# Patient Record
Sex: Female | Born: 1960 | Hispanic: No | Marital: Single | Smoking: Never smoker
Health system: Southern US, Community
[De-identification: ages and names within clinical notes are randomized; demographics above are authoritative.]

## PROBLEM LIST (undated history)

## (undated) DIAGNOSIS — I10 Essential (primary) hypertension: Secondary | ICD-10-CM

## (undated) DIAGNOSIS — N289 Disorder of kidney and ureter, unspecified: Secondary | ICD-10-CM

## (undated) DIAGNOSIS — M109 Gout, unspecified: Secondary | ICD-10-CM

## (undated) DIAGNOSIS — E7800 Pure hypercholesterolemia, unspecified: Secondary | ICD-10-CM

---

## 2017-03-22 ENCOUNTER — Emergency Department (HOSPITAL_BASED_OUTPATIENT_CLINIC_OR_DEPARTMENT_OTHER): Payer: BLUE CROSS/BLUE SHIELD

## 2017-03-22 ENCOUNTER — Emergency Department (HOSPITAL_BASED_OUTPATIENT_CLINIC_OR_DEPARTMENT_OTHER)
Admission: EM | Admit: 2017-03-22 | Discharge: 2017-03-22 | Disposition: A | Discharge status: Home / Self Care | Payer: BLUE CROSS/BLUE SHIELD | Attending: Emergency Medicine | Admitting: Emergency Medicine

## 2017-03-22 ENCOUNTER — Encounter (HOSPITAL_BASED_OUTPATIENT_CLINIC_OR_DEPARTMENT_OTHER): Payer: Self-pay | Admitting: Emergency Medicine

## 2017-03-22 DIAGNOSIS — M25572 Pain in left ankle and joints of left foot: Secondary | ICD-10-CM | POA: Insufficient documentation

## 2017-03-22 DIAGNOSIS — Z79899 Other long term (current) drug therapy: Secondary | ICD-10-CM | POA: Diagnosis not present

## 2017-03-22 DIAGNOSIS — R2242 Localized swelling, mass and lump, left lower limb: Secondary | ICD-10-CM | POA: Diagnosis present

## 2017-03-22 DIAGNOSIS — I10 Essential (primary) hypertension: Secondary | ICD-10-CM | POA: Diagnosis not present

## 2017-03-22 HISTORY — DX: Disorder of kidney and ureter, unspecified: N28.9

## 2017-03-22 HISTORY — DX: Gout, unspecified: M10.9

## 2017-03-22 HISTORY — DX: Essential (primary) hypertension: I10

## 2017-03-22 HISTORY — DX: Pure hypercholesterolemia, unspecified: E78.00

## 2017-03-22 MED ORDER — OXYCODONE-ACETAMINOPHEN 5-325 MG PO TABS
1.0000 | ORAL_TABLET | Freq: Once | ORAL | Status: AC
  Administered 2017-03-22: 1 via ORAL
  Filled 2017-03-22: qty 1

## 2017-03-22 MED ORDER — ONDANSETRON 4 MG PO TBDP
4.0000 mg | ORAL_TABLET | Freq: Once | ORAL | Status: AC
  Administered 2017-03-22: 4 mg via ORAL
  Filled 2017-03-22: qty 1

## 2017-03-22 MED ORDER — ACETAMINOPHEN 500 MG PO TABS
500.0000 mg | ORAL_TABLET | Freq: Once | ORAL | Status: AC
  Administered 2017-03-22: 500 mg via ORAL
  Filled 2017-03-22: qty 1

## 2017-03-22 MED ORDER — METHYLPREDNISOLONE 4 MG PO TBPK: ORAL_TABLET | ORAL | 0 refills | Status: AC

## 2017-03-22 MED FILL — METHYLPREDNISOLONE 4 MG TAB: 4 | 21 days supply | Qty: 21 | Fill #0

## 2017-03-22 NOTE — ED Triage Notes (Addendum)
Pt reports L leg swelling and pain x 1 month, denies SOB. States she has gout but this pain is different. Denies recent long travel.

## 2017-03-22 NOTE — Discharge Instructions (Signed)
You were evaluated in the ED for left ankle pain and swelling. Your ultrasound did not show any blood clots. Your x-ray showed some arthritis but no fractures or dislocations.  I suspect this may be a flare of your gout or other possible soft tissue injury.  We discussed many medications to treat your gout. You chose conservative management with Tylenol and steroids. Please take 1000 mg of Tylenol every 8 hours and methylprednisone as prescribed. Ice to decrease inflammation. Follow-up with your doctor's appointment on Thursday and discuss your recurrent joint pain and gout management. Please hold allopurinol until your symptoms improved and you speak to your primary care doctor. See attached gout eating and diet recommendations.  Return to the ED if you develop fevers, chills, worsening pain and swelling.

## 2017-03-22 NOTE — ED Provider Notes (Signed)
[facility] Provider Note   CSN: [id number] Arrival date & time: 03/22/17  0844     History   Chief Complaint Chief Complaint  Patient presents with  . Leg Swelling    HPI Kristie Rios is a 56 y.o. female with history of hypertension, hyperlipidemia, gout, renal disorder presents to the ED for evaluation of persistent left lower leg swelling and pain 1 month. Swelling and pain most significant at left ankle. Aggravating factors include weightbearing, movement of the ankle and palpation. Has recurrent gout 3-4 times a year, takes prednisone as needed for gout flares at home and she took prednisone for her symptoms and it did not help. States her pain today is different from previous gout flares. Has had gout in her left ankle before. Had shrimp one month ago before symptoms started. Denies heavy alcohol intake. No injury. No history of DVT/PE. No recent prolonged travel, history of malignancy, estrogen use. Denies fever, chest pain, shortness of breath, dyspnea, hemoptysis, W/N/T to her distal left lower leg. No history of diabetes or IV drug use.  HPI  Past Medical History:  Diagnosis Date  . Gout   . High cholesterol   . Hypertension   . Renal disorder     There are no active problems to display for this patient.   No past surgical history on file.  OB History    No data available       Home Medications    Prior to Admission medications   Medication Sig Start Date End Date Taking? Authorizing Provider  atorvastatin (LIPITOR) 40 MG tablet Take 40 mg by mouth daily.   Yes [provider]  lisinopril (PRINIVIL,ZESTRIL) 10 MG tablet Take 10 mg by mouth daily.   Yes [provider]  predniSONE (DELTASONE) 5 MG tablet Take 5 mg by mouth daily with breakfast.   Yes [provider]  methylPREDNISolone (MEDROL DOSEPAK) 4 MG TBPK tablet Take until completion for possible gout 03/22/17   Liberty Handy, PA-C    Family History No family  history on file.  Social History Social History  Substance Use Topics  . Smoking status: Never Smoker  . Smokeless tobacco: Never Used  . Alcohol use Not on file     Allergies   Indocin [indomethacin] and Naproxen   Review of Systems Review of Systems  Constitutional: Negative for chills, diaphoresis and fever.  Respiratory: Negative for chest tightness and shortness of breath.   Cardiovascular: Positive for leg swelling. Negative for chest pain.  Gastrointestinal: Negative for nausea and vomiting.  Musculoskeletal: Positive for arthralgias, gait problem, joint swelling and myalgias.  Skin: Negative for wound.  Neurological: Negative for weakness.     Physical Exam Updated Vital Signs BP 93/65   Pulse (!) 59   Temp 97.9 F (36.6 C) (Oral)   Resp 18   Ht 4\' 11"  (1.499 m)   Wt 64.9 kg (143 lb)   SpO2 95%   BMI 28.88 kg/m   Physical Exam  Constitutional: She is oriented to person, place, and time. She appears well-developed and well-nourished. No distress.  NAD.  HENT:  Head: Normocephalic and atraumatic.  Right Ear: External ear normal.  Left Ear: External ear normal.  Nose: Nose normal.  Mouth/Throat: Oropharynx is clear and moist.  Eyes: Conjunctivae and EOM are normal. No scleral icterus.  Neck: Normal range of motion. Neck supple.  Cardiovascular: Normal rate, regular rhythm, S1 normal, S2 normal and normal heart sounds.   No murmur  heard. Pulses:      Radial pulses are 2+ on the right side, and 2+ on the left side.       Popliteal pulses are 2+ on the right side, and 2+ on the left side.       Dorsalis pedis pulses are 2+ on the right side, and 2+ on the left side.  Mild edema and tenderness to left ankle with medial ankle erythema  Mild anterior left thigh tenderness but no calf or popliteal space tenderness No varicosities seen  Pulmonary/Chest: Effort normal and breath sounds normal. She has no wheezes.  Musculoskeletal: Normal range of motion. She  exhibits no deformity.       Left ankle: She exhibits swelling. Tenderness. Lateral malleolus and medial malleolus tenderness found.  Mild edema and tenderness to left ankle  Full PROM but with reported pain  Achilles tendon intact and painless No bony tenderness over posterior aspect of lateral and medial malleoli, navicular bone or 5th metatarsal base.   Negative anterior and posterior drawer.   Neurological: She is alert and oriented to person, place, and time.  Skin: Skin is warm and dry. Capillary refill takes less than 2 seconds. There is erythema.  Mild erythema to medial left ankle without streaking up leg, no warmth or fluctuance  Psychiatric: She has a normal mood and affect. Her behavior is normal. Judgment and thought content normal.  Nursing note and vitals reviewed.    ED Treatments / Results  Labs (all labs ordered are listed, but only abnormal results are displayed) Labs Reviewed - No data to display  EKG  EKG Interpretation None       Radiology Dg Ankle Complete Left  Result Date: 03/22/2017 CLINICAL DATA:  Right ankle pain with swelling and redness for 2 months. EXAM: LEFT ANKLE COMPLETE - 3+ VIEW COMPARISON:  None. FINDINGS: There is no evidence of fracture, dislocation, or joint effusion. Osteophyte formation at the medial aspect of the ankle joint. Circumferential soft tissue swelling. Enthesophytes on the calcaneus. IMPRESSION: Slight arthritic changes.  No acute bony abnormality. Electronically Signed   By: Francene Boyers M.D.   On: 03/22/2017 09:45   US Venous Img Lower Unilateral Left  Result Date: 03/22/2017 CLINICAL DATA:  Left leg pain and swelling for 1 month. EXAM: LEFT LOWER EXTREMITY VENOUS DOPPLER ULTRASOUND TECHNIQUE: Gray-scale sonography with graded compression, as well as color Doppler and duplex ultrasound were performed to evaluate the lower extremity deep venous systems from the level of the common femoral vein and including the common  femoral, femoral, profunda femoral, popliteal and calf veins including the posterior tibial, peroneal and gastrocnemius veins when visible. The superficial great saphenous vein was also interrogated. Spectral Doppler was utilized to evaluate flow at rest and with distal augmentation maneuvers in the common femoral, femoral and popliteal veins. COMPARISON:  None. FINDINGS: Contralateral Common Femoral Vein: Respiratory phasicity is normal and symmetric with the symptomatic side. No evidence of thrombus. Normal compressibility. Common Femoral Vein: No evidence of thrombus. Normal compressibility, respiratory phasicity and response to augmentation. Saphenofemoral Junction: No evidence of thrombus. Normal compressibility and flow on color Doppler imaging. Profunda Femoral Vein: No evidence of thrombus. Normal compressibility and flow on color Doppler imaging. Femoral Vein: No evidence of thrombus. Normal compressibility, respiratory phasicity and response to augmentation. Popliteal Vein: No evidence of thrombus. Normal compressibility, respiratory phasicity and response to augmentation. Calf Veins: No evidence of thrombus. Normal compressibility and flow on color Doppler imaging. Superficial Great Saphenous Vein: No evidence  of thrombus. Normal compressibility and flow on color Doppler imaging. Venous Reflux:  None. Other Findings:  None. IMPRESSION: No evidence of DVT within the left lower extremity. Electronically Signed   By: Myles Rosenthal M.D.   On: 03/22/2017 10:45    Procedures Procedures (including critical care time)  Medications Ordered in ED Medications  acetaminophen (TYLENOL) tablet 500 mg (not administered)  oxyCODONE-acetaminophen (PERCOCET/ROXICET) 5-325 MG per tablet 1 tablet (1 tablet Oral Given 03/22/17 0944)     Initial Impression / Assessment and Plan / ED Course  I have reviewed the triage vital signs and the nursing notes.  Pertinent labs & imaging results that were available during my  care of the patient were reviewed by me and considered in my medical decision making (see chart for details).  Clinical Course as of Mar 22 1138  Tue Mar 22, 2017  1610 FINDINGS: There is no evidence of fracture, dislocation, or joint effusion. Osteophyte formation at the medial aspect of the ankle joint. Circumferential soft tissue swelling. Enthesophytes on the calcaneus.  IMPRESSION: Slight arthritic changes. No acute bony abnormality. DG Ankle Complete Left [CG]    Clinical Course User Index [CG] Liberty Handy, PA-C   56 year old female with history of hypertension, hyperlipidemia, recurrent gout and renal disorder presents to ED for persistent left ankle edema and tenderness x 1 month Medial ankle erythema noted on exam today but no warmth. Has full ankle PROM with mild reported pain. Has had a history of gout to this joint before. Denies trauma. No history of DVT/PE.  No h/o DM or IVDU. No preceding URI or GI illnesses prior. Admits to eating shrimp before symptoms started. Has tried prednisone at home which she typically uses for gout flares but this did not help. Initial differential diagnosis includes septic arthritis,, tendinopathy, reactive arthritis however higher suspicion for gout, arthritis, DVT. Will ice, elevate, percocet and get U/S and x-ray and reassess.   Final Clinical Impressions(s) / ED Diagnoses   Final diagnoses:  Acute left ankle pain   X-ray shows arthritic changes and soft tissue swelling but no other acute pathology. Ultrasound negative for DVT. Discussed medical management of gout with patient. She is taking allopurinol daily, states that her gout has gotten worse since starting this medication. Discussed colchicine and need for creatinine checked today in order to start this medication however patient opted conservative management with Tylenol and methylprednisone. She has a scheduled PCP appointment in 4 days, states she will discuss her symptoms with her  PCP then. We'll discharged with 1 g Tylenol 3 times a day, methylprednisone pack, ice, hold allopurinol. Patient verbalized understanding and is agreeable with plan.  Patient, ED treatment and discharge plan was discussed with supervising physician who is agreeable with plan.  New Prescriptions New Prescriptions   METHYLPREDNISOLONE (MEDROL DOSEPAK) 4 MG TBPK TABLET    Take until completion for possible gout     Liberty Handy, PA-C 03/22/17 1139    Charlynne Pander, MD 03/22/17 [phone]

## 2017-03-22 NOTE — ED Notes (Signed)
Pt did not want ice pack placed due to her being cold.

## 2017-03-22 NOTE — ED Notes (Signed)
Pt taking the tylenol, pt vomited. PA made aware and verbal order for ODT zofran given. PA advised to continue with discharge after giving zofran.

## 2018-04-30 IMAGING — CR DG ANKLE COMPLETE 3+V*L*
3 series · 3 of 3 positions shown · non-contrast
Comparison: None.

CLINICAL DATA: Right ankle pain with swelling and redness for 2
months.

EXAM:
LEFT ANKLE COMPLETE - 3+ VIEW

[t ankle joint ap left]
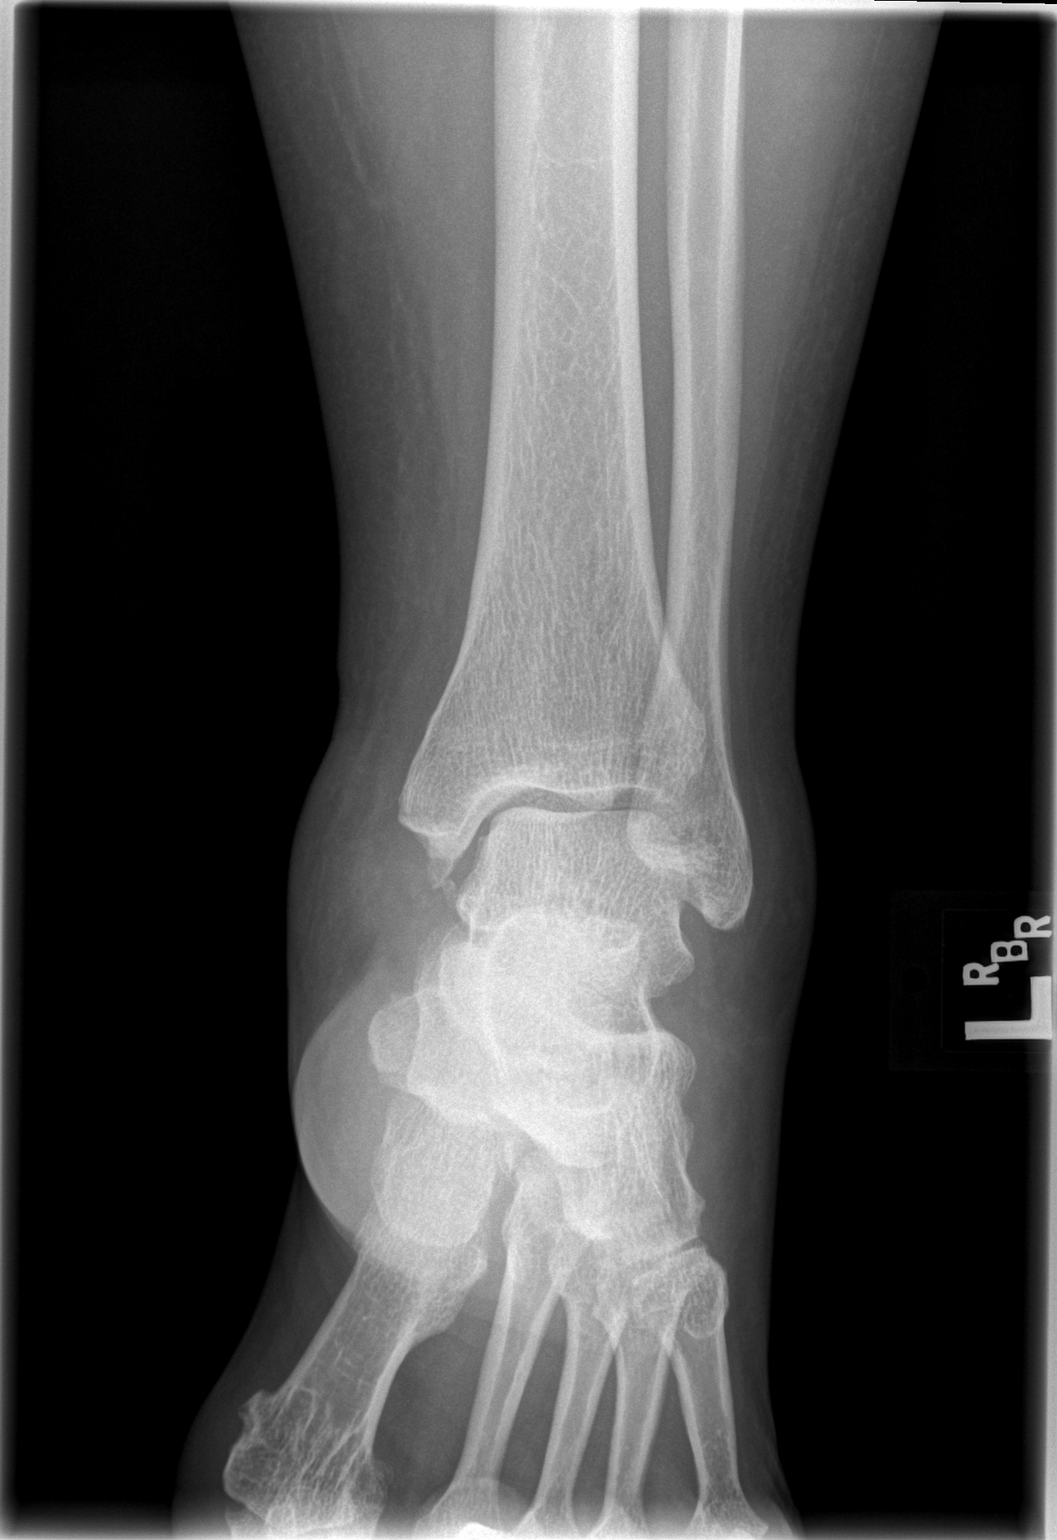

[t ankle joint oblique left]
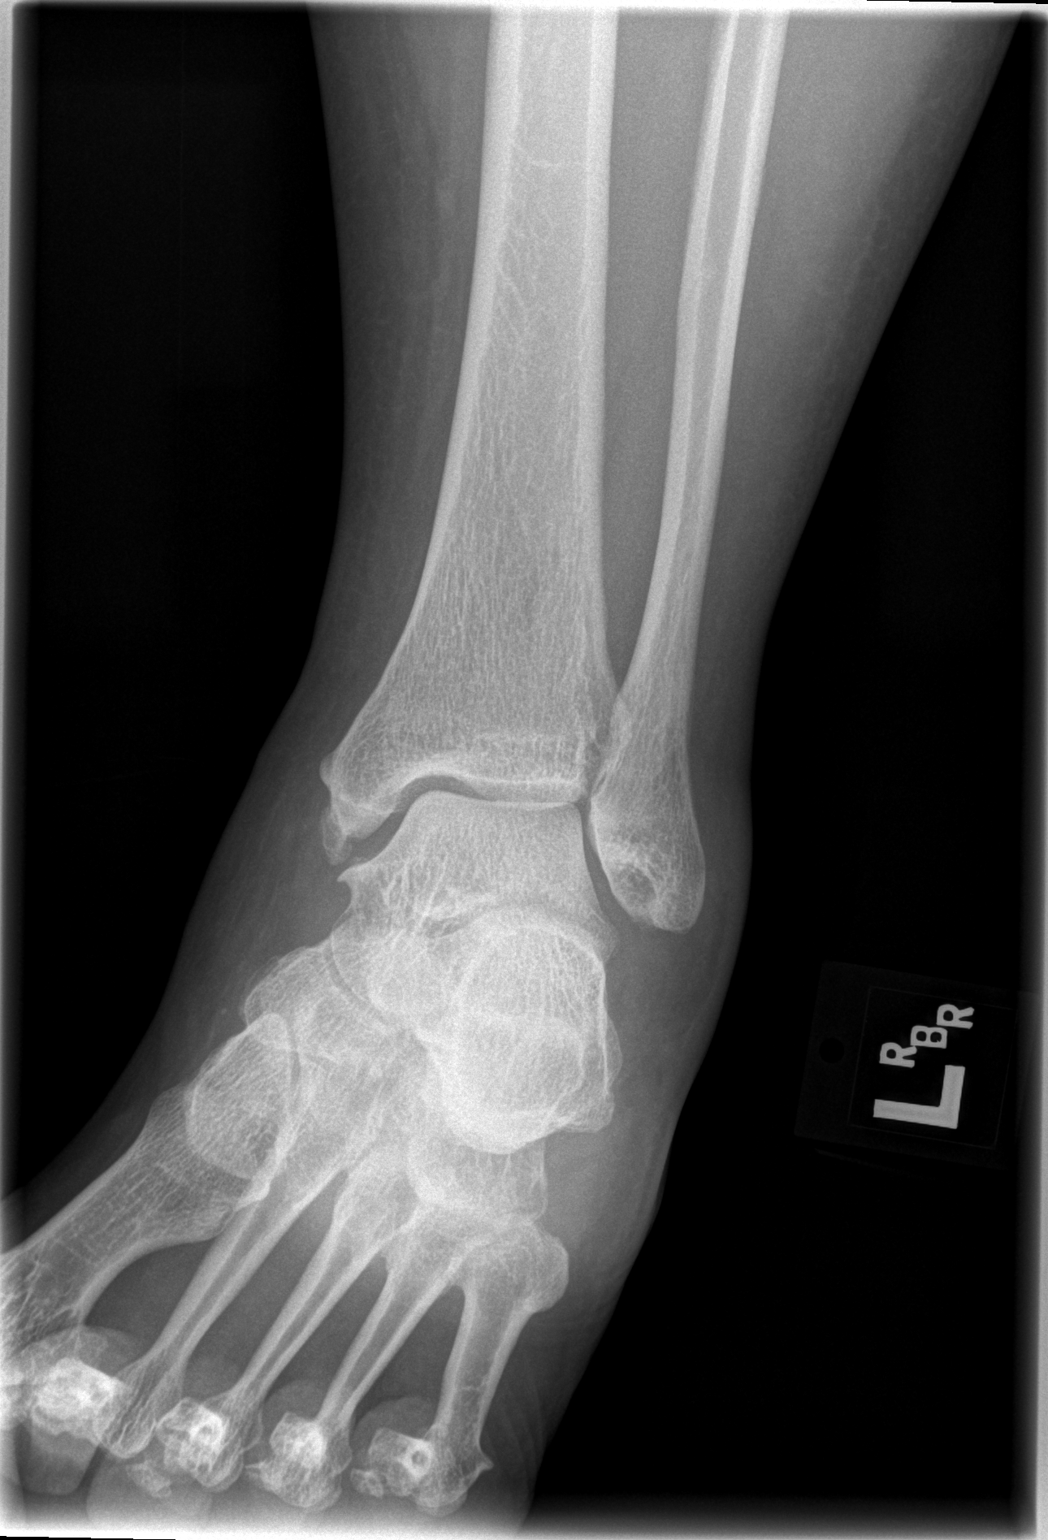

[t ankle joint lat left]
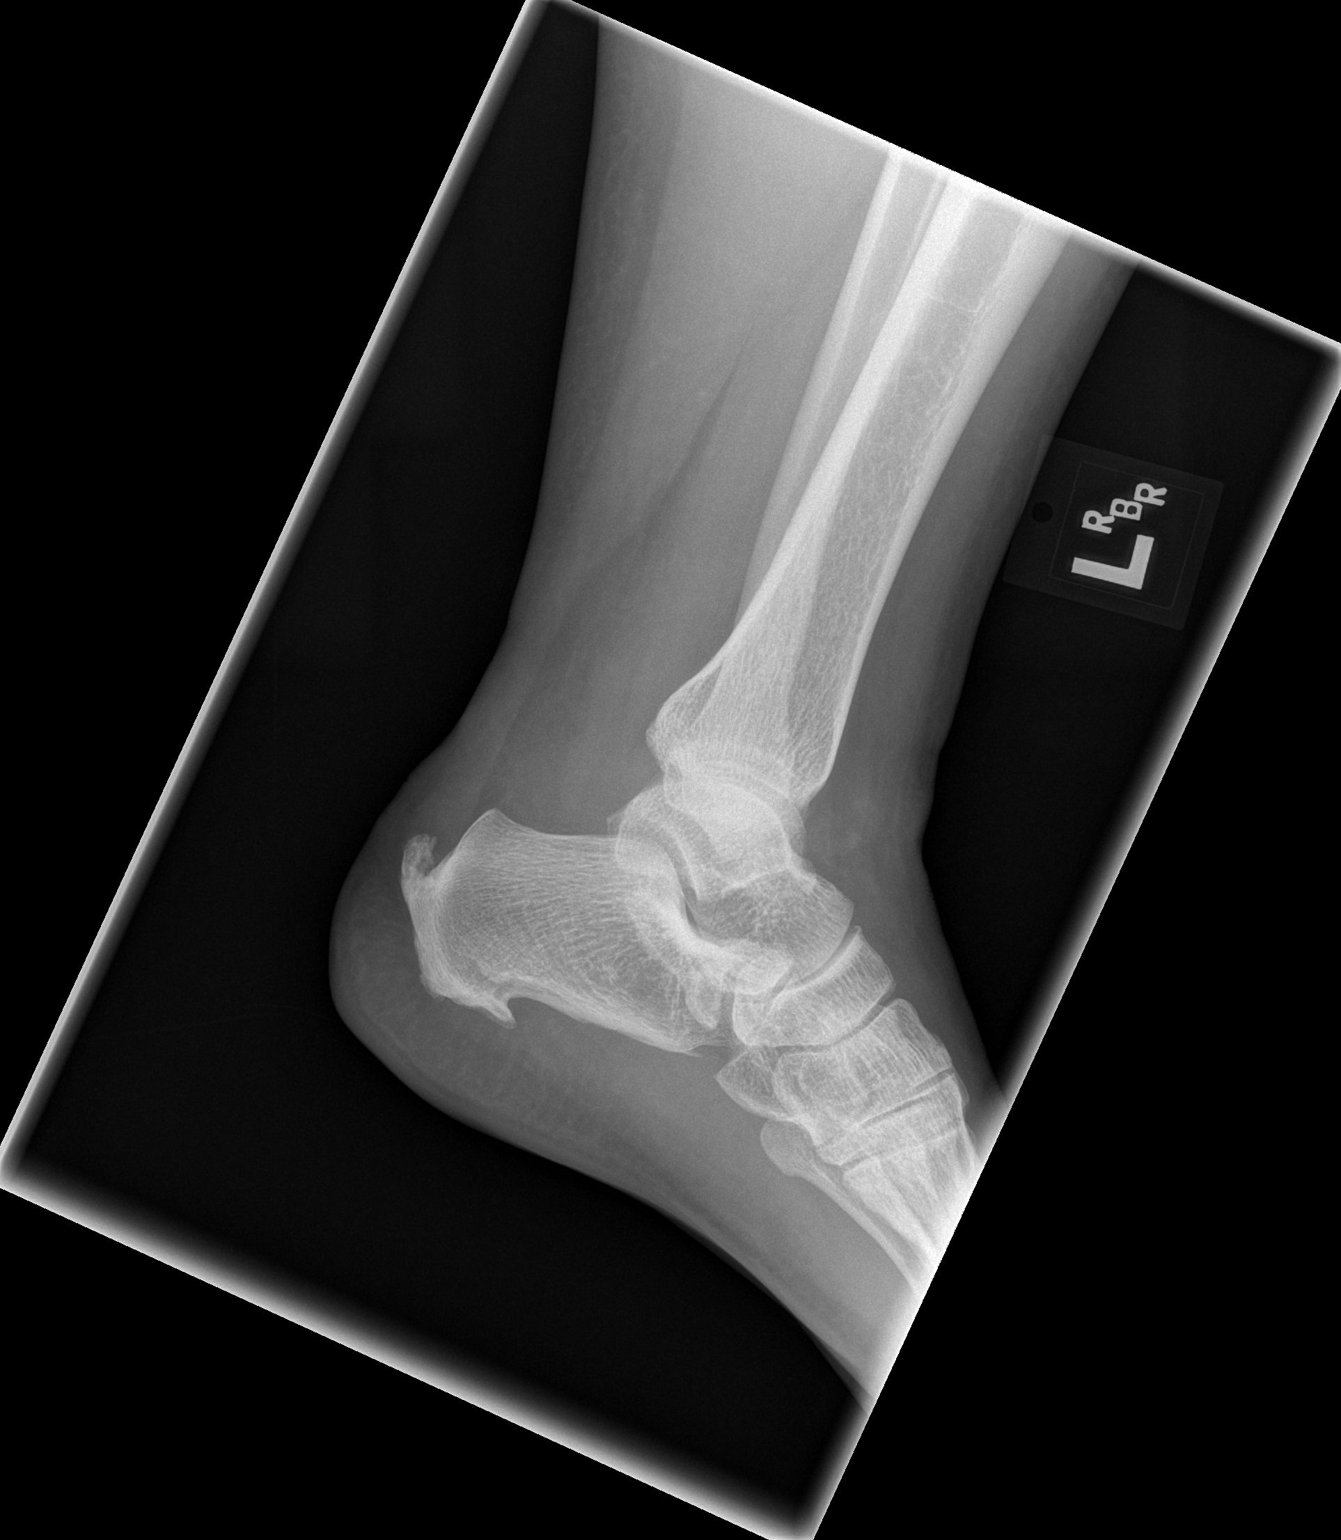

[3 of 3 positions shown; findings below may reference images not displayed]

FINDINGS: There is no evidence of fracture, dislocation, or joint effusion.
Osteophyte formation at the medial aspect of the ankle joint.
Circumferential soft tissue swelling. Enthesophytes on the
calcaneus.
IMPRESSION: Slight arthritic changes.  No acute bony abnormality.

## 2019-05-07 IMAGING — US US EXTREM LOW VENOUS*L*
1 series · 13 of 24 positions shown · non-contrast
Comparison: None.

CLINICAL DATA: Left leg pain and swelling for 1 month.



[Series 1: us extrem low venous*left* · 0.08mm/px · 13 of 31 slices shown]
[im 1/31]
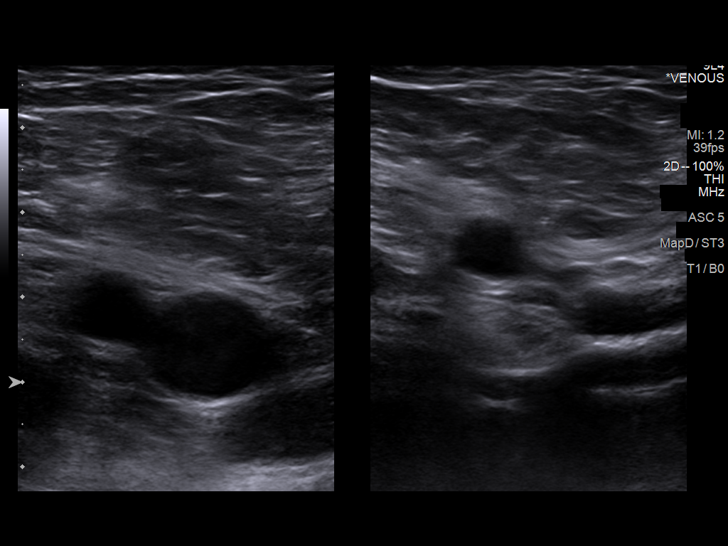
[im 3/31]
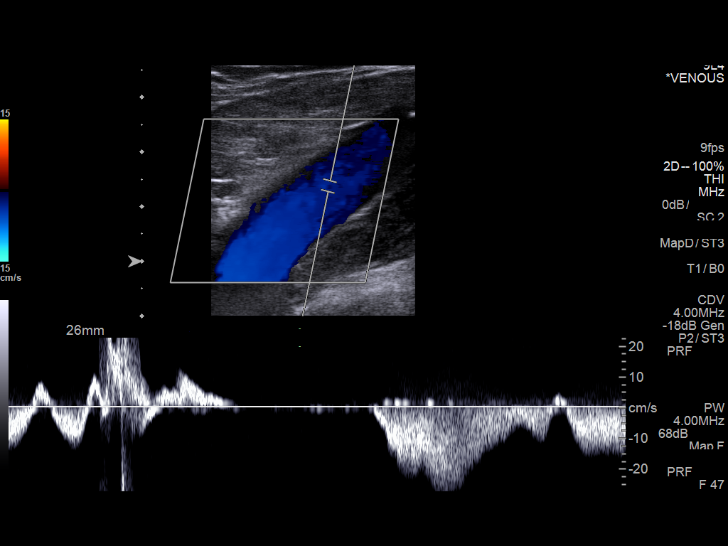
[im 6/31]
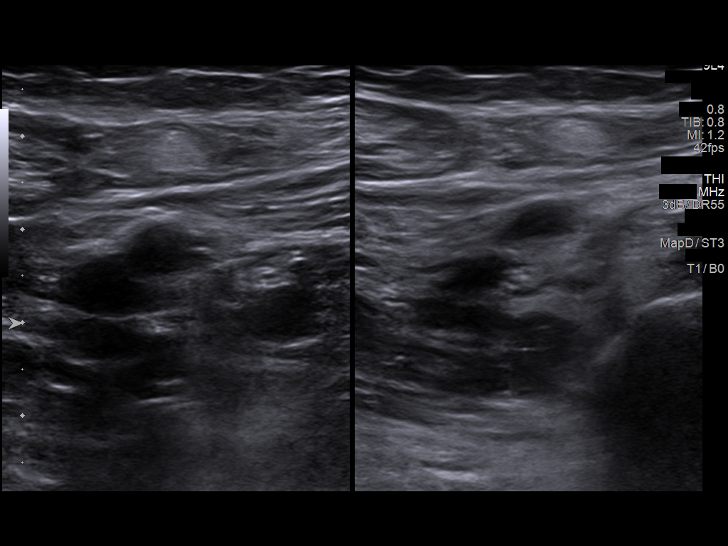
[im 8/31]
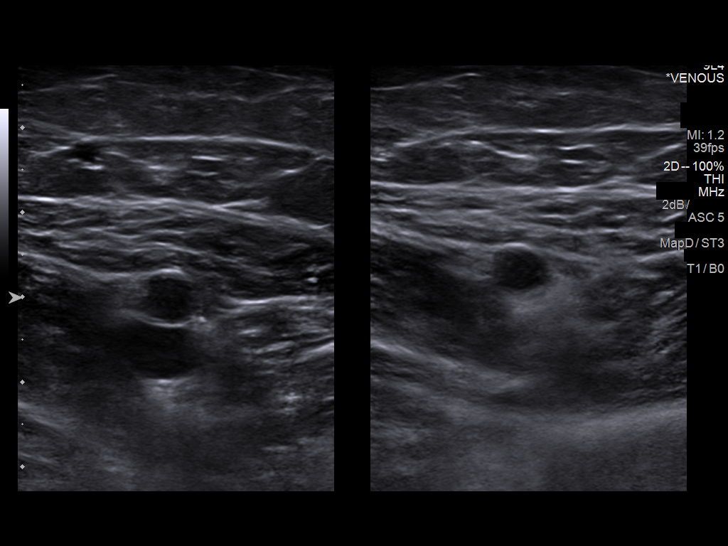
[im 11/31]
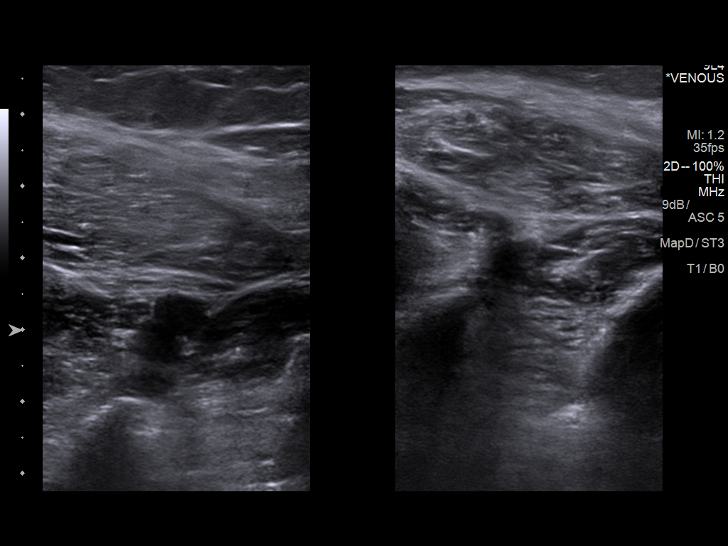
[im 14/31]
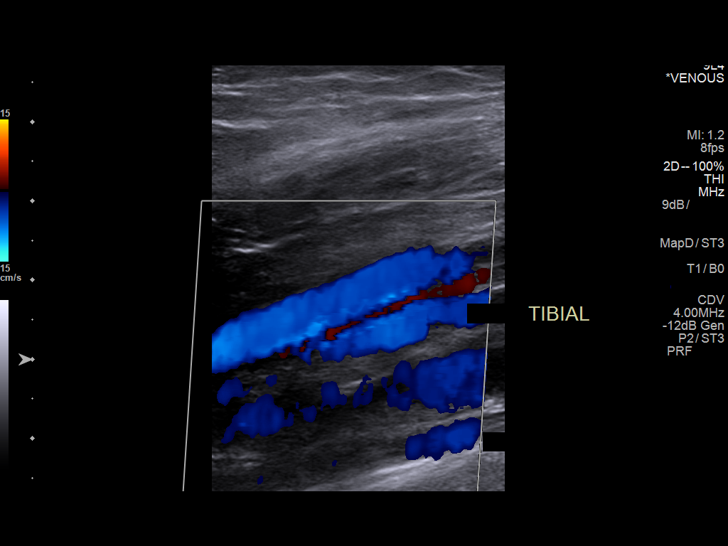
[im 16/31]
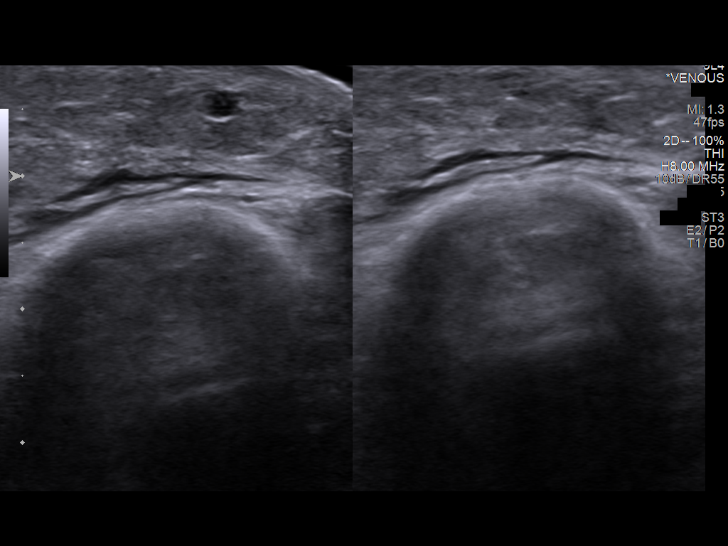
[im 17/31]
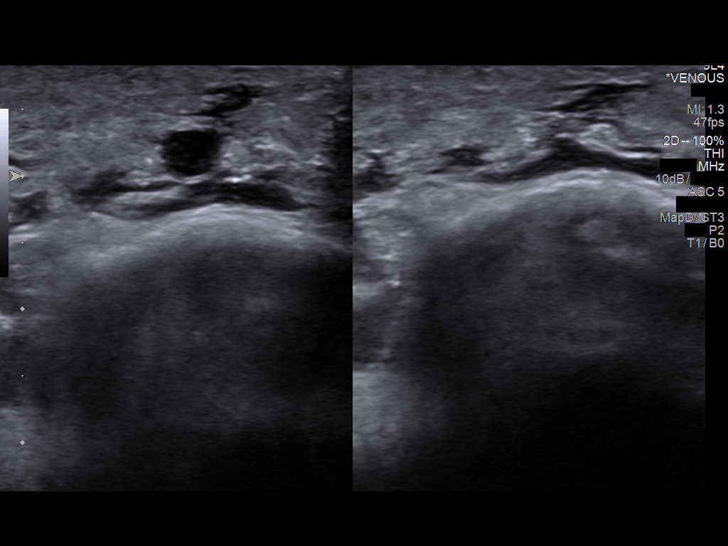
[im 20/31]
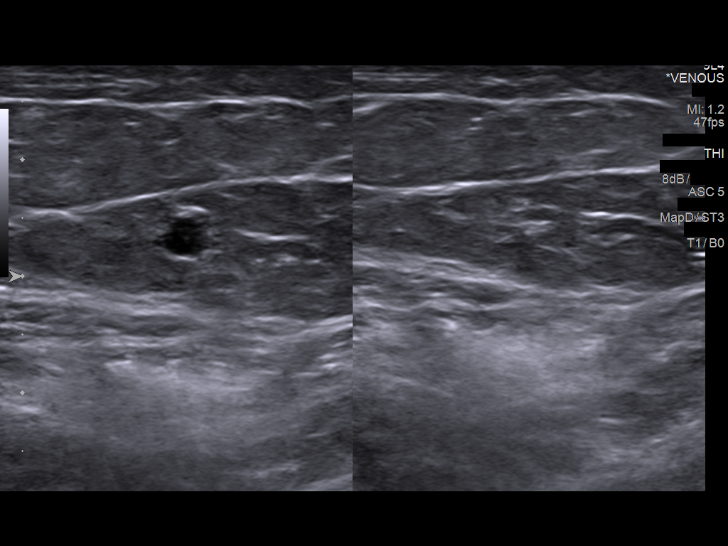
[im 23/31]
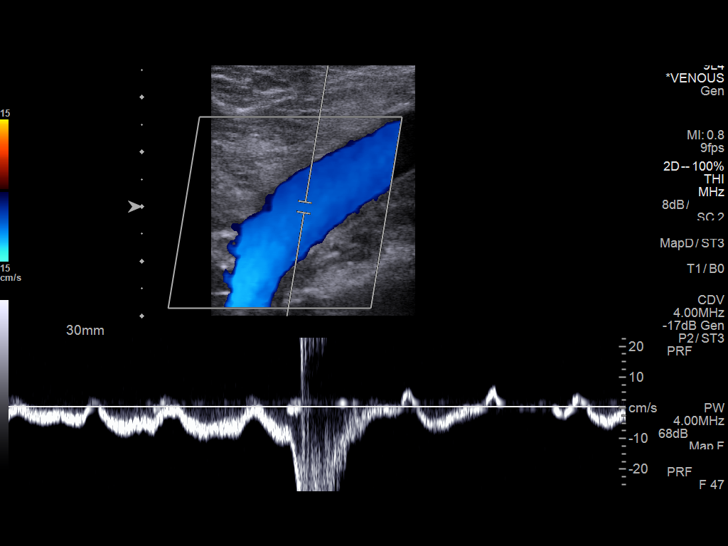
[im 25/31]
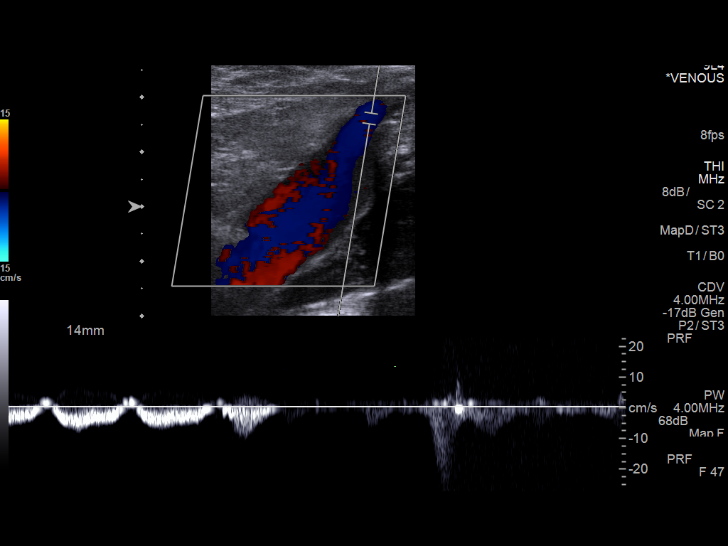
[im 28/31]
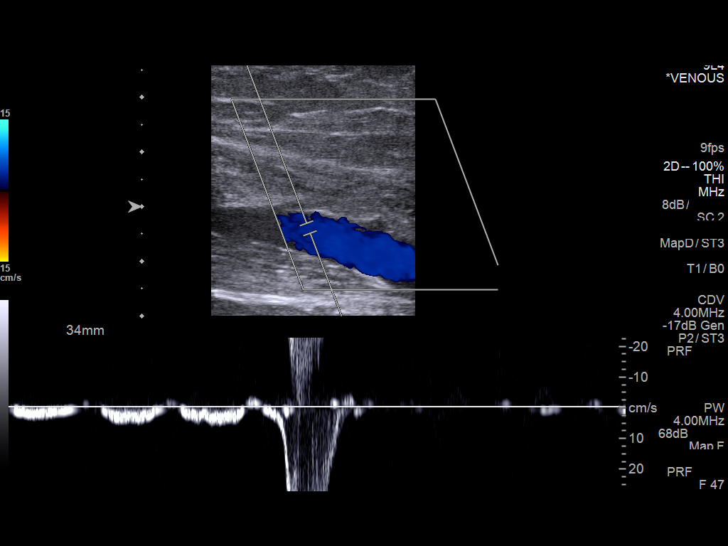
[im 31/31]
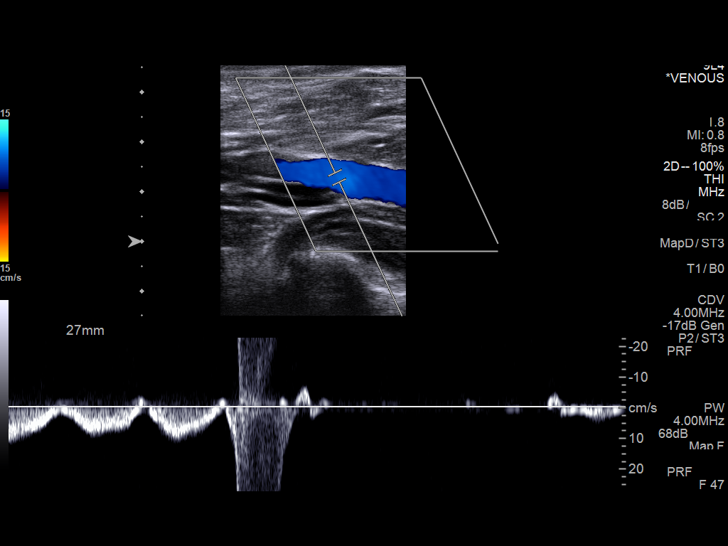

[13 of 24 positions shown; findings below may reference images not displayed]

FINDINGS: Contralateral Common Femoral Vein: Respiratory phasicity is normal
and symmetric with the symptomatic side. No evidence of thrombus.
Normal compressibility.

Common Femoral Vein: No evidence of thrombus. Normal
compressibility, respiratory phasicity and response to augmentation.

Saphenofemoral Junction: No evidence of thrombus. Normal
compressibility and flow on color Doppler imaging.

Profunda Femoral Vein: No evidence of thrombus. Normal
compressibility and flow on color Doppler imaging.

Femoral Vein: No evidence of thrombus. Normal compressibility,
respiratory phasicity and response to augmentation.

Popliteal Vein: No evidence of thrombus. Normal compressibility,
respiratory phasicity and response to augmentation.

Calf Veins: No evidence of thrombus. Normal compressibility and flow
on color Doppler imaging.

Superficial Great Saphenous Vein: No evidence of thrombus. Normal
compressibility and flow on color Doppler imaging.

Venous Reflux:  None.

Other Findings:  None.
IMPRESSION: No evidence of DVT within the left lower extremity.
